# Patient Record
Sex: Male | Born: 1995 | Race: White | Hispanic: No | Marital: Single | Smoking: Current some day smoker
Health system: Southern US, Community
[De-identification: ages and names within clinical notes are randomized; demographics above are authoritative.]

## PROBLEM LIST (undated history)

## (undated) DIAGNOSIS — J45909 Unspecified asthma, uncomplicated: Secondary | ICD-10-CM

## (undated) HISTORY — PX: JOINT REPLACEMENT: SHX530

---

## 2016-10-16 ENCOUNTER — Emergency Department (HOSPITAL_COMMUNITY)
Admission: EM | Admit: 2016-10-16 | Discharge: 2016-10-16 | Disposition: A | Discharge status: Home / Self Care | Payer: Self-pay | Attending: Emergency Medicine | Admitting: Emergency Medicine

## 2016-10-16 ENCOUNTER — Emergency Department (HOSPITAL_COMMUNITY): Payer: Self-pay

## 2016-10-16 ENCOUNTER — Encounter (HOSPITAL_COMMUNITY): Payer: Self-pay | Admitting: *Deleted

## 2016-10-16 DIAGNOSIS — G8929 Other chronic pain: Secondary | ICD-10-CM | POA: Insufficient documentation

## 2016-10-16 DIAGNOSIS — R05 Cough: Secondary | ICD-10-CM | POA: Insufficient documentation

## 2016-10-16 DIAGNOSIS — R61 Generalized hyperhidrosis: Secondary | ICD-10-CM | POA: Insufficient documentation

## 2016-10-16 DIAGNOSIS — T148XXA Other injury of unspecified body region, initial encounter: Secondary | ICD-10-CM

## 2016-10-16 DIAGNOSIS — F17210 Nicotine dependence, cigarettes, uncomplicated: Secondary | ICD-10-CM | POA: Insufficient documentation

## 2016-10-16 DIAGNOSIS — J45909 Unspecified asthma, uncomplicated: Secondary | ICD-10-CM | POA: Insufficient documentation

## 2016-10-16 DIAGNOSIS — Y929 Unspecified place or not applicable: Secondary | ICD-10-CM | POA: Insufficient documentation

## 2016-10-16 DIAGNOSIS — M546 Pain in thoracic spine: Secondary | ICD-10-CM | POA: Insufficient documentation

## 2016-10-16 DIAGNOSIS — M549 Dorsalgia, unspecified: Secondary | ICD-10-CM

## 2016-10-16 HISTORY — DX: Unspecified asthma, uncomplicated: J45.909

## 2016-10-16 LAB — CBC WITH DIFFERENTIAL/PLATELET
Basophils Absolute: 0.1 10*3/uL (ref 0.0–0.1)
Basophils Relative: 1 %
EOS PCT: 4 %
Eosinophils Absolute: 0.4 10*3/uL (ref 0.0–0.7)
HCT: 39.8 % (ref 39.0–52.0)
HEMOGLOBIN: 14 g/dL (ref 13.0–17.0)
LYMPHS ABS: 2.9 10*3/uL (ref 0.7–4.0)
LYMPHS PCT: 26 %
MCH: 31 pg (ref 26.0–34.0)
MCHC: 35.2 g/dL (ref 30.0–36.0)
MCV: 88.1 fL (ref 78.0–100.0)
MONOS PCT: 5 %
Monocytes Absolute: 0.6 10*3/uL (ref 0.1–1.0)
NEUTROS PCT: 64 %
Neutro Abs: 7.2 10*3/uL (ref 1.7–7.7)
Platelets: 263 10*3/uL (ref 150–400)
RBC: 4.52 MIL/uL (ref 4.22–5.81)
RDW: 13 % (ref 11.5–15.5)
WBC: 11.1 10*3/uL — AB (ref 4.0–10.5)

## 2016-10-16 LAB — I-STAT CHEM 8, ED
BUN: 14 mg/dL (ref 6–20)
CHLORIDE: 102 mmol/L (ref 101–111)
Calcium, Ion: 1.17 mmol/L (ref 1.15–1.40)
Creatinine, Ser: 0.9 mg/dL (ref 0.61–1.24)
GLUCOSE: 92 mg/dL (ref 65–99)
HCT: 42 % (ref 39.0–52.0)
Hemoglobin: 14.3 g/dL (ref 13.0–17.0)
POTASSIUM: 4.5 mmol/L (ref 3.5–5.1)
Sodium: 139 mmol/L (ref 135–145)
TCO2: 28 mmol/L (ref 22–32)

## 2016-10-16 LAB — URINALYSIS, ROUTINE W REFLEX MICROSCOPIC
BILIRUBIN URINE: NEGATIVE
GLUCOSE, UA: NEGATIVE mg/dL
HGB URINE DIPSTICK: NEGATIVE
Ketones, ur: NEGATIVE mg/dL
Leukocytes, UA: NEGATIVE
Nitrite: NEGATIVE
PROTEIN: NEGATIVE mg/dL
SPECIFIC GRAVITY, URINE: 1.018 (ref 1.005–1.030)
pH: 5 (ref 5.0–8.0)

## 2016-10-16 LAB — COMPREHENSIVE METABOLIC PANEL
ALT: 18 U/L (ref 17–63)
ANION GAP: 7 (ref 5–15)
AST: 20 U/L (ref 15–41)
Albumin: 4.4 g/dL (ref 3.5–5.0)
Alkaline Phosphatase: 64 U/L (ref 38–126)
BUN: 11 mg/dL (ref 6–20)
CO2: 25 mmol/L (ref 22–32)
CREATININE: 0.97 mg/dL (ref 0.61–1.24)
Calcium: 9.4 mg/dL (ref 8.9–10.3)
Chloride: 104 mmol/L (ref 101–111)
Glucose, Bld: 89 mg/dL (ref 65–99)
Potassium: 4.2 mmol/L (ref 3.5–5.1)
Sodium: 136 mmol/L (ref 135–145)
Total Bilirubin: 0.9 mg/dL (ref 0.3–1.2)
Total Protein: 6.9 g/dL (ref 6.5–8.1)

## 2016-10-16 LAB — D-DIMER, QUANTITATIVE: D-Dimer, Quant: 0.61 ug/mL-FEU — ABNORMAL HIGH (ref 0.00–0.50)

## 2016-10-16 LAB — RAPID HIV SCREEN (HIV 1/2 AB+AG)
HIV 1/2 Antibodies: NONREACTIVE
HIV-1 P24 ANTIGEN - HIV24: NONREACTIVE

## 2016-10-16 LAB — I-STAT TROPONIN, ED: TROPONIN I, POC: 0 ng/mL (ref 0.00–0.08)

## 2016-10-16 LAB — SEDIMENTATION RATE: SED RATE: 2 mm/h (ref 0–16)

## 2016-10-16 LAB — LIPASE, BLOOD: LIPASE: 23 U/L (ref 11–51)

## 2016-10-16 MED ORDER — OXYCODONE-ACETAMINOPHEN 5-325 MG PO TABS: 1.0000 | ORAL_TABLET | Freq: Four times a day (QID) | ORAL | 0 refills | Status: AC | PRN

## 2016-10-16 MED ORDER — METHOCARBAMOL 500 MG PO TABS
500.0000 mg | ORAL_TABLET | Freq: Once | ORAL | Status: AC
  Administered 2016-10-16: 500 mg via ORAL
  Filled 2016-10-16: qty 1

## 2016-10-16 MED ORDER — IBUPROFEN 600 MG PO TABS: 600.0000 mg | ORAL_TABLET | Freq: Four times a day (QID) | ORAL | 0 refills | Status: AC | PRN

## 2016-10-16 MED ORDER — ONDANSETRON HCL 4 MG/2ML IJ SOLN
4.0000 mg | Freq: Once | INTRAMUSCULAR | Status: AC
  Administered 2016-10-16: 4 mg via INTRAVENOUS
  Filled 2016-10-16: qty 2

## 2016-10-16 MED ORDER — OXYCODONE-ACETAMINOPHEN 5-325 MG PO TABS
1.0000 | ORAL_TABLET | Freq: Once | ORAL | Status: AC
  Administered 2016-10-16: 1 via ORAL
  Filled 2016-10-16: qty 1

## 2016-10-16 MED ORDER — IOPAMIDOL (ISOVUE-370) INJECTION 76%
INTRAVENOUS | Status: AC
  Administered 2016-10-16: 80 mL
  Filled 2016-10-16: qty 100

## 2016-10-16 MED ORDER — CYCLOBENZAPRINE HCL 5 MG PO TABS: 5.0000 mg | ORAL_TABLET | Freq: Three times a day (TID) | ORAL | 0 refills | Status: AC | PRN

## 2016-10-16 MED ORDER — SODIUM CHLORIDE 0.9 % IV BOLUS (SEPSIS)
1000.0000 mL | Freq: Once | INTRAVENOUS | Status: AC
  Administered 2016-10-16: 1000 mL via INTRAVENOUS

## 2016-10-16 MED ORDER — KETOROLAC TROMETHAMINE 15 MG/ML IJ SOLN
15.0000 mg | Freq: Once | INTRAMUSCULAR | Status: AC
  Administered 2016-10-16: 15 mg via INTRAVENOUS
  Filled 2016-10-16: qty 1

## 2016-10-16 NOTE — ED Notes (Signed)
Patient transported to CT 

## 2016-10-16 NOTE — ED Provider Notes (Signed)
[facility]EMERGENCY DEPT Provider Note   CSN: [id number] Arrival date & time: 10/16/16  4098     History   Chief Complaint Chief Complaint  Patient presents with  . Back Pain    HPI Juan Lawrence is a 21 y.o. male.  HPI   Patient is a 21 year old male with a history of asthma and clavicle surgery of the left shoulder presenting for acute on chronic upper back pain. Patient reports he has had this pain for at least 6 months, however it worsened yesterday while he was sitting at his desk during a training course. Patient denies any history of trauma at the onset of the symptoms. The first episode of pain occurred when patient was smoking a cigarette. Patient reports pain is midline to left of midline in lower cervical/upper thoracic region, and 9 out of 10 in quality. Patient reports that breathing, swallowing, or coughing exacerbates the pain. Patient reports he has a slightly increased cough with this episode, but nonproductive of blood or sputum. No chest pain or shortness of breath. Patient denies fever or chills. Patient denies any saddle anesthesia, loss of bowel or bladder control, or urinary retention. Patient denies any weakness or numbness in his upper or lower extremities. Patient denies any lower extremity edema. Patient denies any history of cancer or IV drug use. Patient is a current every day smoker. Patient reports he has tried ice, heat, and ibuprofen for his symptoms.   As a secondary problem, patient notes that he has had dry mucous membranes over a similar interval axis back pain and reports that he has had difficulty swallowing due to an upper throat feeling of obstruction. Patient reports this has caused him to lose weight over the past several months.   Past Medical History:  Diagnosis Date  . Asthma     There are no active problems to display for this patient.   Past Surgical History:  Procedure Laterality Date  . JOINT REPLACEMENT         Home  Medications    Prior to Admission medications   Not on File    Family History No family history on file.  Social History Social History  Substance Use Topics  . Smoking status: Current Some Day Smoker    Packs/day: 0.50    Types: Cigarettes  . Smokeless tobacco: Never Used  . Alcohol use No     Allergies   Patient has no known allergies.   Review of Systems Review of Systems  Constitutional: Positive for appetite change and diaphoresis. Negative for chills and fever.  HENT: Negative for rhinorrhea and sore throat.   Respiratory: Positive for cough. Negative for chest tightness and shortness of breath.   Cardiovascular: Negative for chest pain and leg swelling.  Gastrointestinal: Negative for abdominal pain, diarrhea, nausea and vomiting.  Genitourinary: Negative for dysuria and flank pain.  Musculoskeletal: Positive for arthralgias and back pain. Negative for myalgias.  Skin: Negative for rash.  Neurological: Negative for dizziness, weakness, light-headedness, numbness and headaches.     Physical Exam Updated Vital Signs BP 118/76   Pulse 94   Temp 98.1 F (36.7 C) (Oral)   Resp 16   SpO2 97%   Physical Exam  Constitutional: He appears well-developed and well-nourished. No distress.  Thin appearing young male.  HENT:  Head: Normocephalic and atraumatic.  Eyes: Conjunctivae and EOM are normal. Right eye exhibits no discharge. Left eye exhibits no discharge.  EOMs normal to gross examination.  Neck: Normal range  of motion.  Cardiovascular: Normal rate, regular rhythm and normal heart sounds.   Intact, 2+ radial pulse.  Pulmonary/Chest: Effort normal and breath sounds normal. He has no wheezes. He has no rales.  Normal respiratory effort. Patient converses comfortably. No audible wheeze or stridor.  Abdominal: Soft. He exhibits no distension. There is no tenderness. There is no guarding.  Musculoskeletal: Normal range of motion. He exhibits no edema or  deformity.  INSPECTION: Symmetry of scapula and no obvious deformity of the spine.  PALPATION: Midline to paraspinal musculature tenderness lower cervical/upper thoracic spine.  MOTOR: 5/5 strength b/l with resisted shoulder abduction/adduction, biceps flexion (C5/6), biceps extension (C6-C8), wrist flexion, wrist extension (C6-C8), and grip strength (C7-T1) SENSORY: Sensation is intact to light touch in:  Superficial radial nerve distribution (dorsal first web space) Median nerve distribution (tip of index finger)   Ulnar nerve distribution (tip of small finger)   Lymphadenopathy:    He has no cervical adenopathy.  Neurological: He is alert.  Cranial nerves intact to gross observation. Full ROM of upper and lower extremities. Strength 5/5 upper/lower extremities. Gait symmetric and ambulation without difficulty.  Skin: Skin is warm and dry. No rash noted. He is not diaphoretic.  Psychiatric: He has a normal mood and affect. Judgment and thought content normal.  Nursing note and vitals reviewed.    ED Treatments / Results  Labs (all labs ordered are listed, but only abnormal results are displayed) Labs Reviewed  CBC WITH DIFFERENTIAL/PLATELET  COMPREHENSIVE METABOLIC PANEL  LIPASE, BLOOD  SEDIMENTATION RATE  RAPID HIV SCREEN (HIV 1/2 AB+AG)  URINALYSIS, ROUTINE W REFLEX MICROSCOPIC  D-DIMER, QUANTITATIVE (NOT AT [facility])  I-STAT CHEM 8, ED  I-STAT TROPONIN, ED    EKG  EKG Interpretation None       Radiology No results found.  Procedures Procedures (including critical care time)  Medications Ordered in ED Medications  sodium chloride 0.9 % bolus 1,000 mL (not administered)  ondansetron (ZOFRAN) injection 4 mg (not administered)     Initial Impression / Assessment and Plan / ED Course  I have reviewed the triage vital signs and the nursing notes.  Pertinent labs & imaging results that were available during my care of the patient were reviewed by me and considered  in my medical decision making (see chart for details).     Final Clinical Impressions(s) / ED Diagnoses   Final diagnoses:  Back pain   MDM  Patient with acute on chronic upper back pain. Differential diagnoses for this acute on chronic pain explored include inflammatory spondyloarthropathies, musculoskeletal strain, pulmonary embolism, epidural abscess, disc herniation, thoracic outlet syndrome. Additionally, patient appears to have dysphagia that has not been evaluated which is seemingly unrelated to this pain at this time. Patient reevaluated multiple times after Toradol, Robaxin, and finally after Percocet for pain control. Due to concern for further systemic illness or generalized inflammatory state, workup initiated in the consultation of Dr. Chaney Malling. D-dimer slightly elevated, prompting further workup with CTPA. Patient neurologically intact and demonstrates no further requirement for further imaging at this time. Suspect possible nerve impingement or muscle spasm. Patient given strict return precautions for any worsening neurologic symptoms such as numbness or weakness in the upper/lower extremities, loss of bowel or bladder control, urinary retention. Patient with severe pain and currently in transit with his significant other for a work training. Patient to establish primary care follow-up and possible subsequent specialty follow-up and in his home state of  [place].  I have reviewed  the patient's information in the [facility] Controlled Substance Database for the past 2 yaers and found them to have no controlled substance prescriptions. [place] searched and no controlled substance prescriptions identified in 2 years.  Opiates were prescribed for an acute, painful condition. The patient was given information on side effects and encouraged to use other, non-opiate pain medication primary, only using opiate medicine sparingly for severe pain. Patient instructed not to drive  on this medication.  This is a shared visit with Dr. Chaney Malling. Patient was independently evaluated by this attending physician. Attending physician assisted in evaluation, prescribing, and discharge management.  New Prescriptions New Prescriptions   No medications on file     Delia Chimes 10/16/16 1855    Charlynne Pander, MD 10/17/16 [phone]

## 2016-10-16 NOTE — Discharge Instructions (Addendum)
Take motrin for pain.   Take flexeril for muscle spasms.   Take percocet for severe pain.   See wellness center or your doctor in [place] for follow up   Return to ER if you have worse abdominal pain, chest pain, back pain, dehydration, fever, vomiting.

## 2016-10-16 NOTE — ED Notes (Signed)
Pt st's he does not have any money to get Rx's filled.  Explained to pt that I would talk to case manager ref help with Flexeril.  Pt verbalizes understanding.

## 2016-10-16 NOTE — ED Notes (Signed)
Pt reports pain from mid-back to epigastric area 1 year. States has "an eating disorder", has difficulty swallowing, "can't eat much", "vomits a lot". Pain became very severe last pm.

## 2016-10-16 NOTE — ED Triage Notes (Signed)
To ED for eval of upper back pain for the past 2 days - severe. Pt states prior to the past 2 days the pain was there but not as severe. Pain increases with any movement of left arm, neck, or twisting. Denies trauma. No increased pain with right arm movement.

## 2018-12-06 IMAGING — CR DG LUMBAR SPINE COMPLETE 4+V
5 series · 5 of 5 positions shown · non-contrast
Comparison: None in PACs.

CLINICAL DATA: Via back pain for the past 2 days. Milder pain
previous to this. Pain is increase with twisting motion. No known
trauma.

EXAM:
LUMBAR SPINE - COMPLETE 4+ VIEW

[l-spine ap]
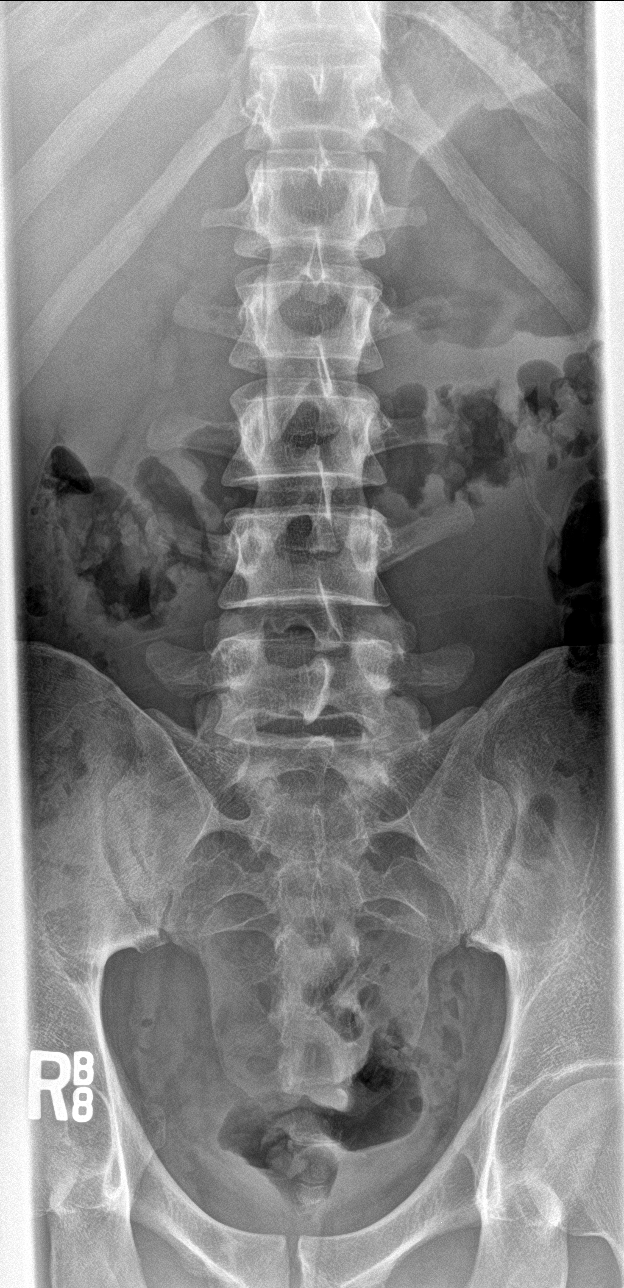

[l-spine obl (1 of 2)]
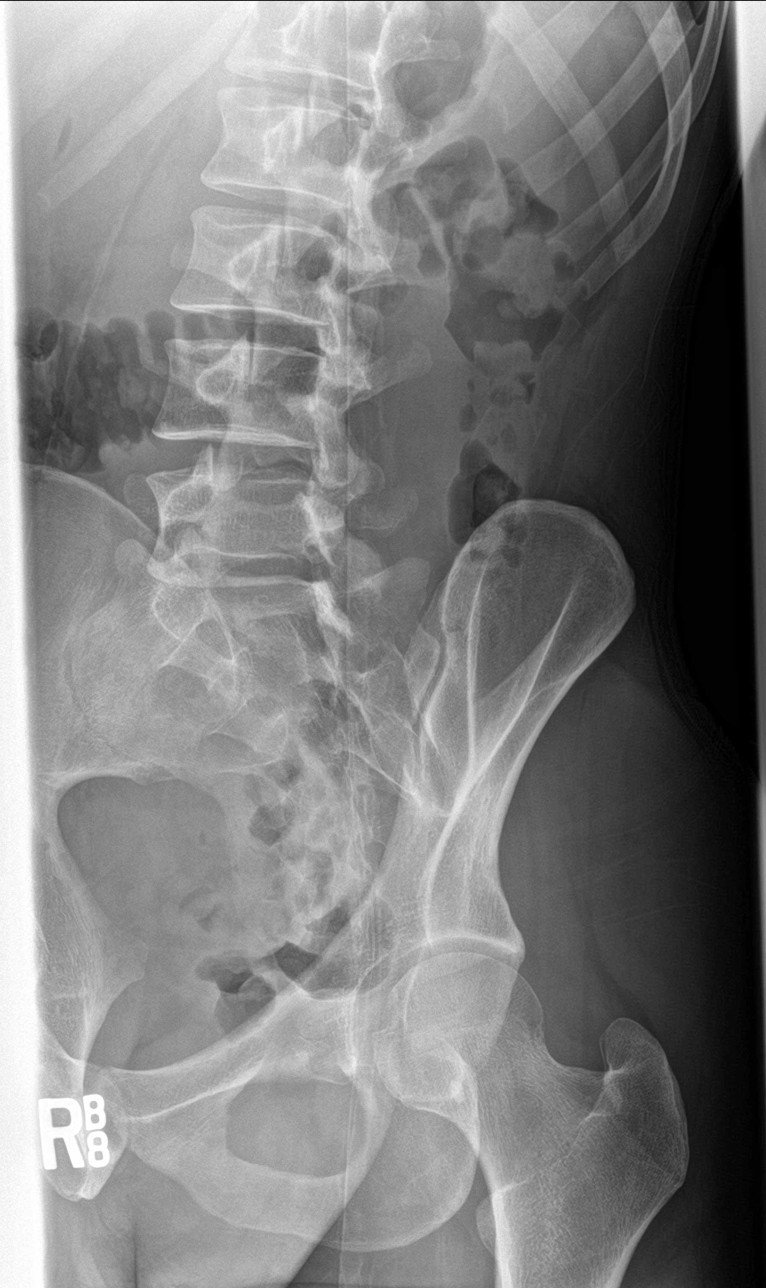

[l-spine obl (2 of 2)]
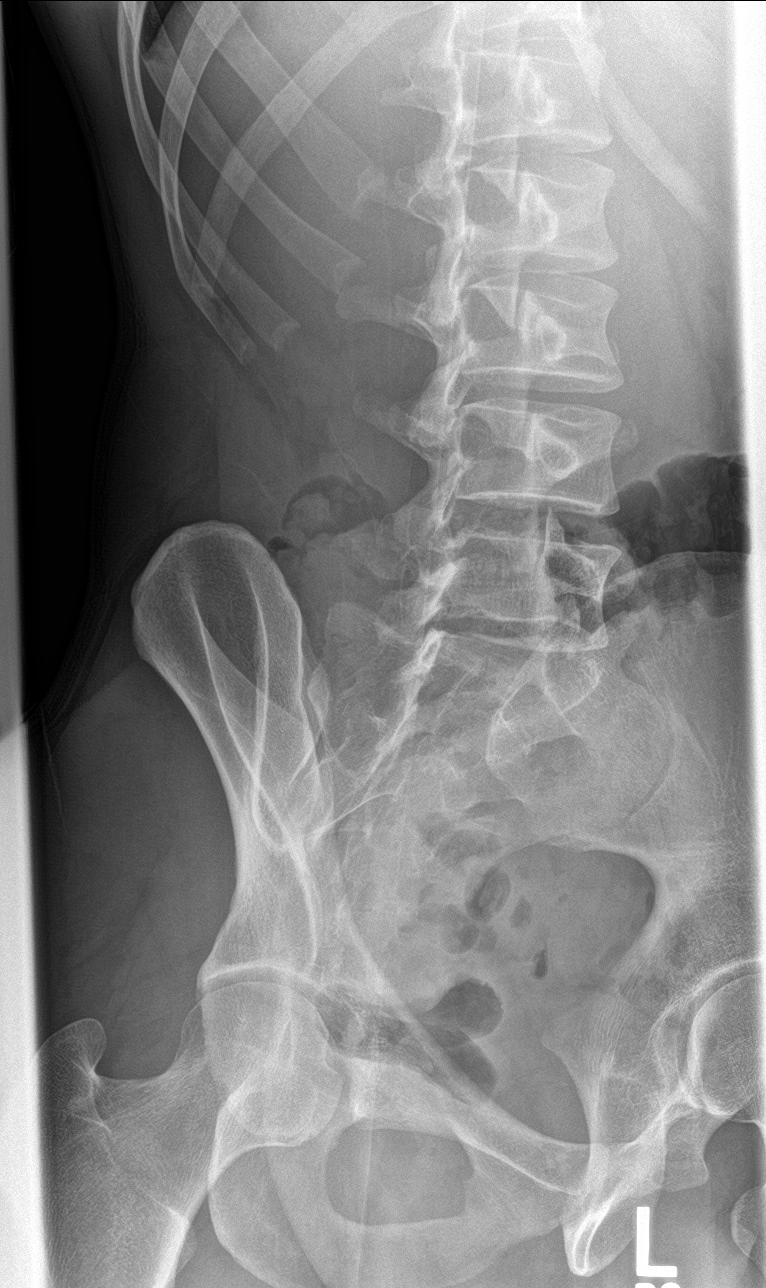

[l-spine lat]
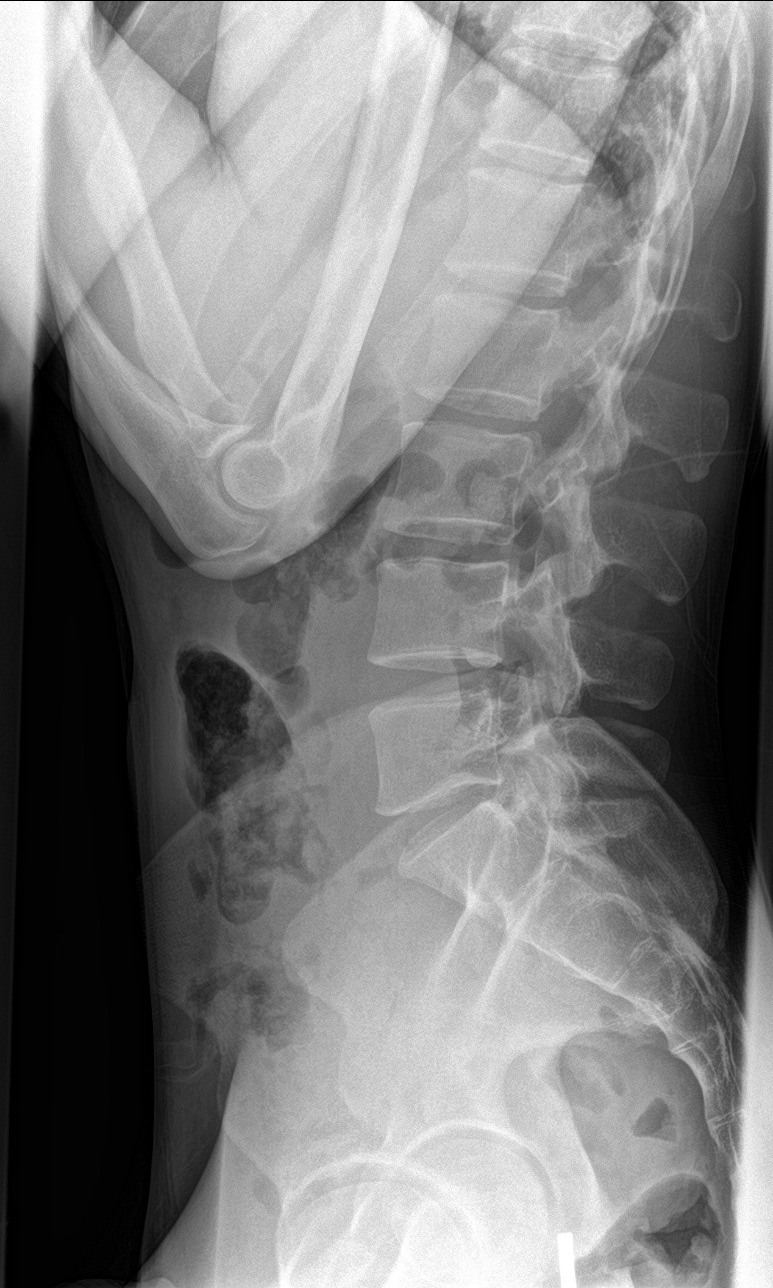

[l-spine spot]
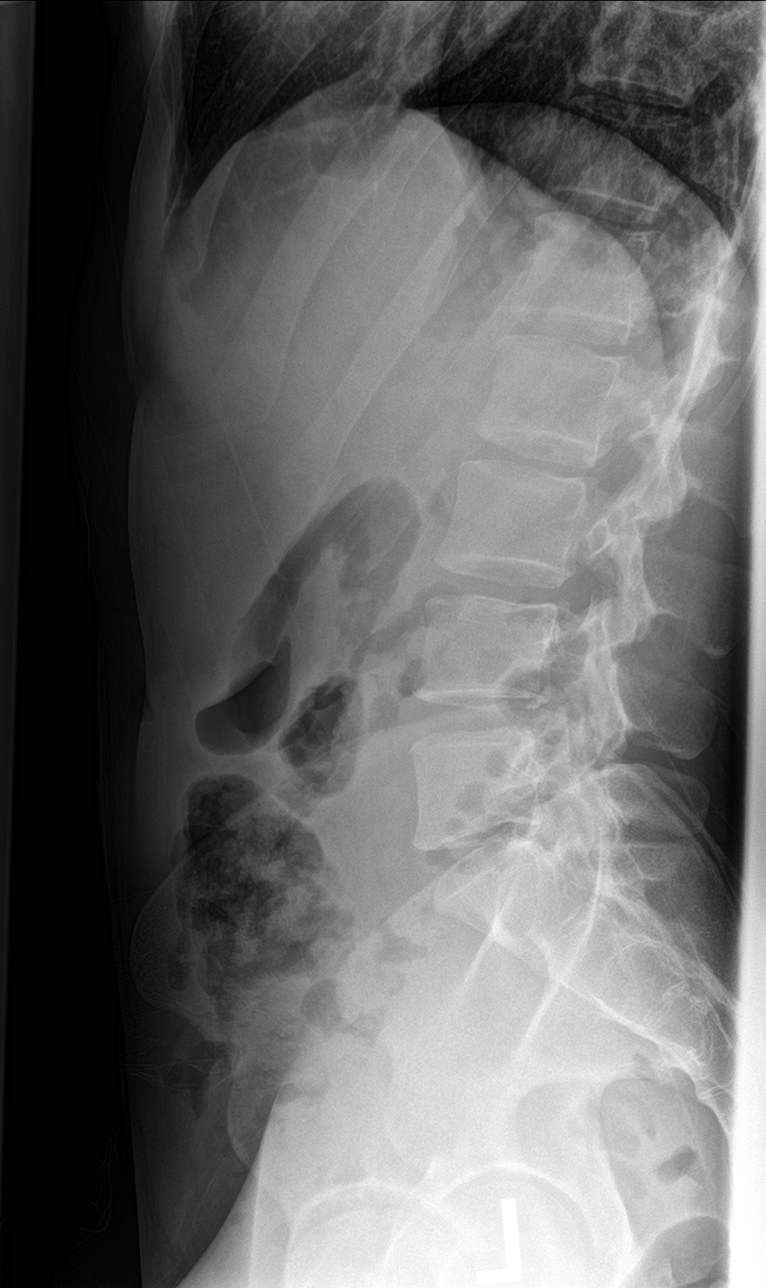

[5 of 5 positions shown; findings below may reference images not displayed]

FINDINGS: The lumbar vertebral bodies are preserved in height. The pedicles
and transverse processes are intact. The disc space heights are well
maintained. There is no spondylolisthesis. There is no significant
facet joint hypertrophy. The observed portions of the sacrum are
normal.
IMPRESSION: There is no acute or chronic bony abnormality of the lumbar spine.

## 2018-12-06 IMAGING — CT CT ANGIO CHEST
2 of 6 series · 18 of 36 positions shown · IV contrast (Omni 300)
Comparison: Chest radiograph October 16, 2016

CLINICAL DATA: Shortness of breath and chest pain

EXAM:
CT ANGIOGRAPHY CHEST WITH CONTRAST
TECHNIQUE: Multidetector CT imaging of the chest was performed using the
standard protocol during bolus administration of intravenous
contrast. Multiplanar CT image reconstructions and MIPs were
obtained to evaluate the vascular anatomy.
CONTRAST:  80 mL Isovue 370 nonionic

[Series 7: pe thins · axial · 0.75mm/px · z∈[-320,-44]mm · 17 of 312 slices shown]
[im 18/312  lung]
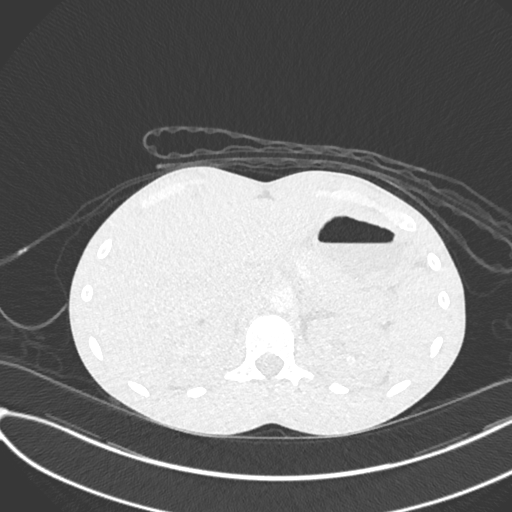
[im 35/312  mediastinal]
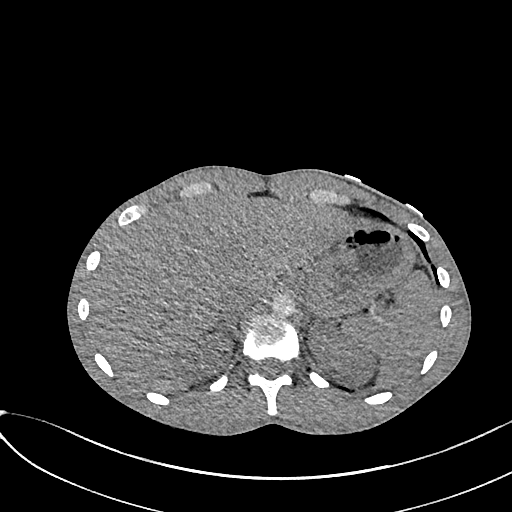
[im 52/312  lung]
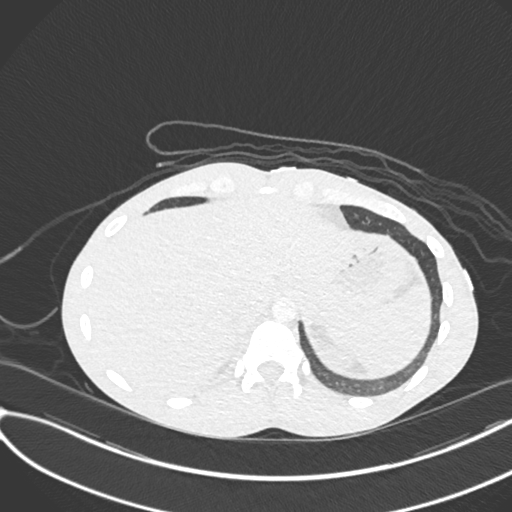
[im 70/312  mediastinal]
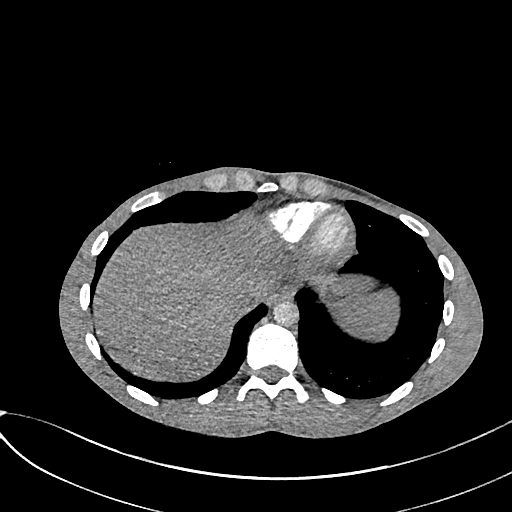
[im 87/312  lung]
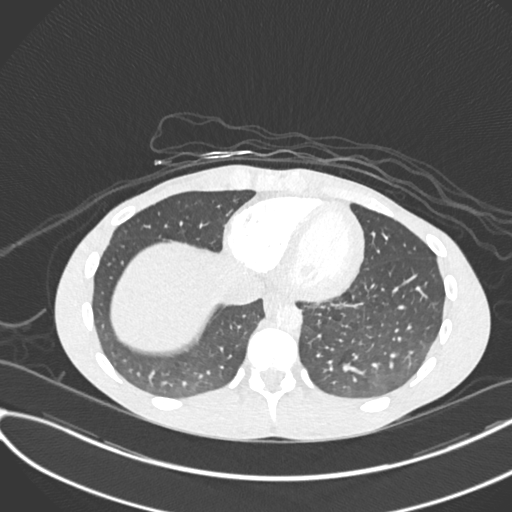
[im 104/312  mediastinal]
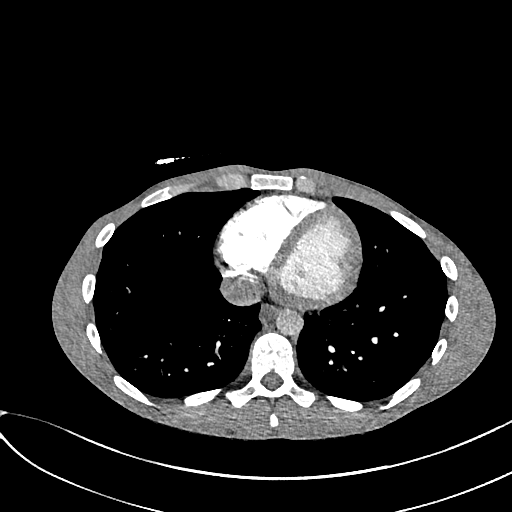
[im 121/312  lung]
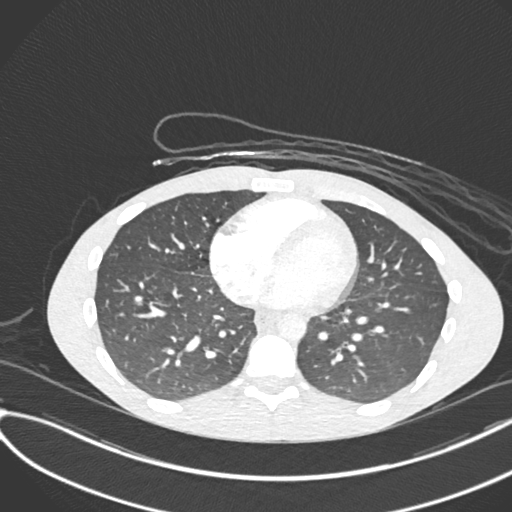
[im 139/312  mediastinal]
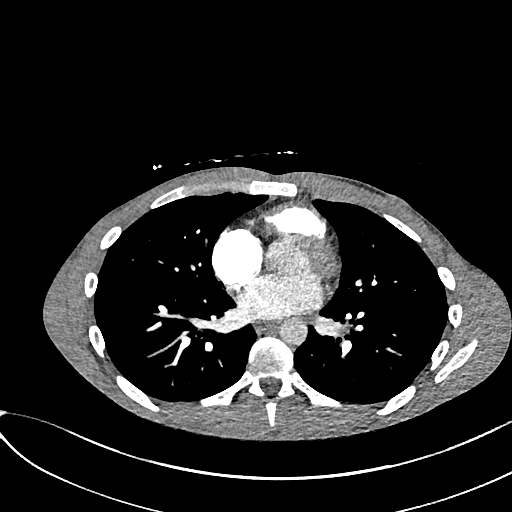
[im 156/312  lung]
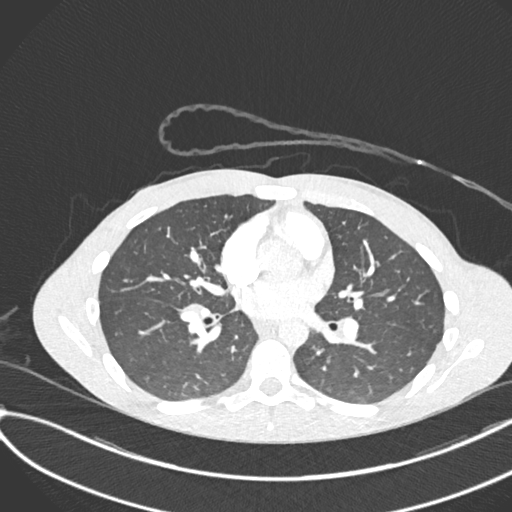
[im 173/312  mediastinal]
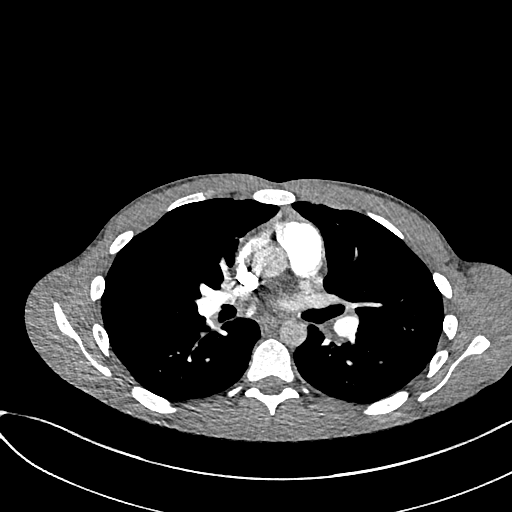
[im 191/312  lung]
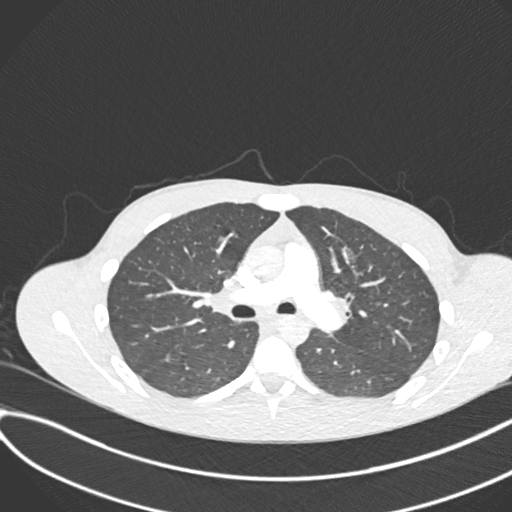
[im 208/312  mediastinal]
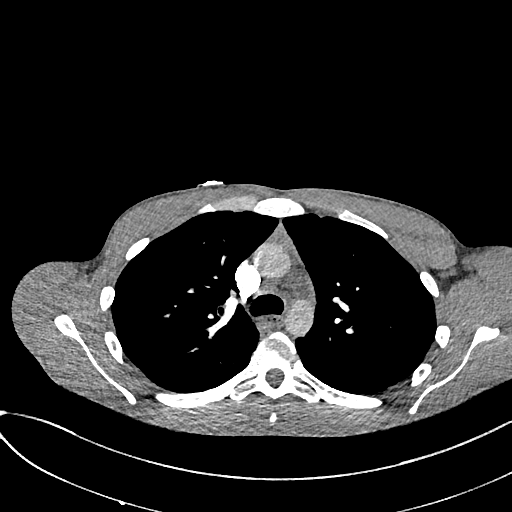
[im 225/312  lung]
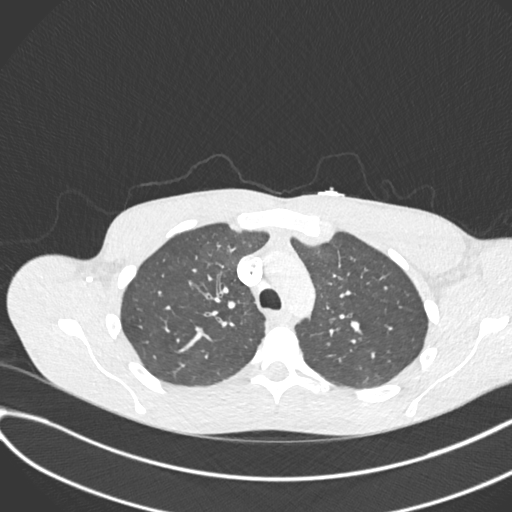
[im 242/312  mediastinal]
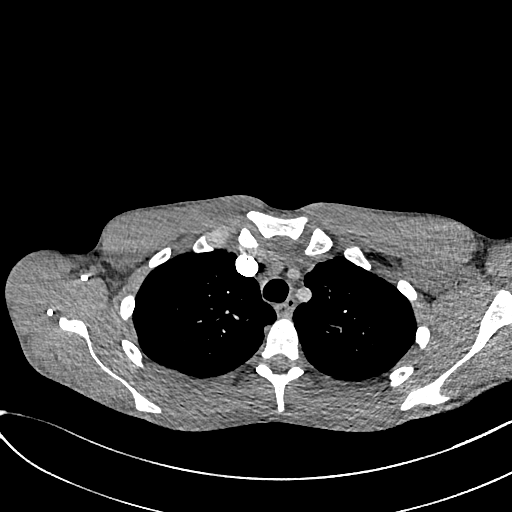
[im 260/312  lung]
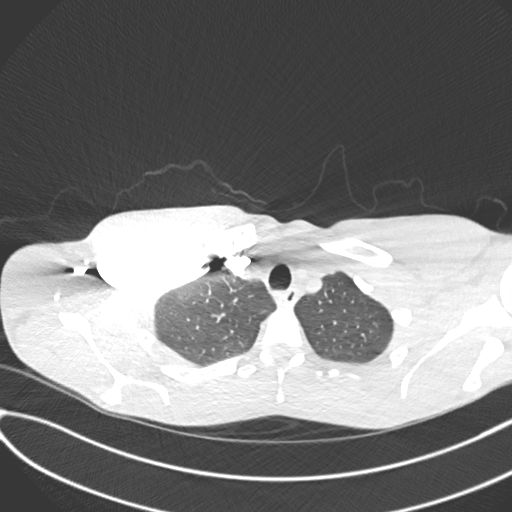
[im 277/312  mediastinal]
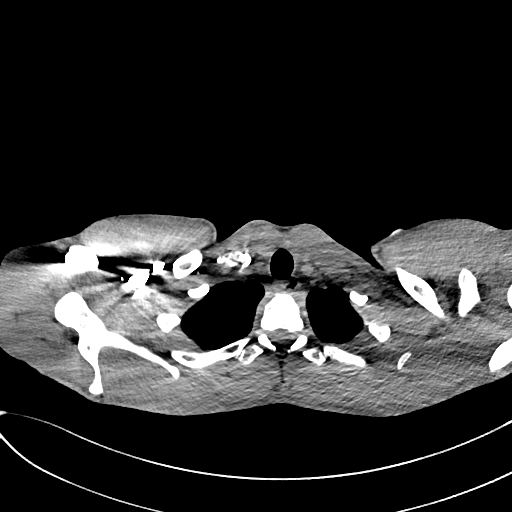
[im 294/312  lung]
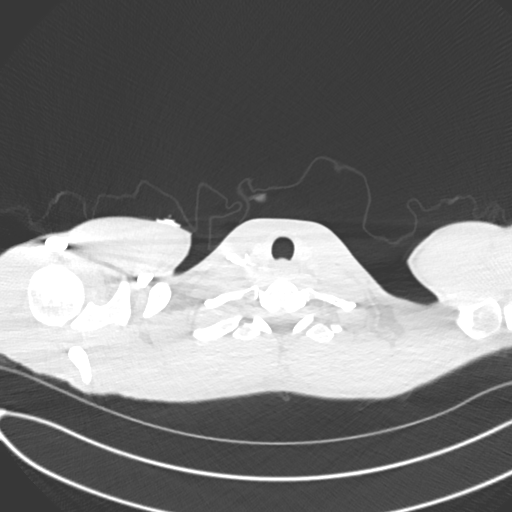

[Series 8: pe 2mm cor · coronal · 0.61mm/px · 1 of 109 slices shown]
[im 55/109  mediastinal]
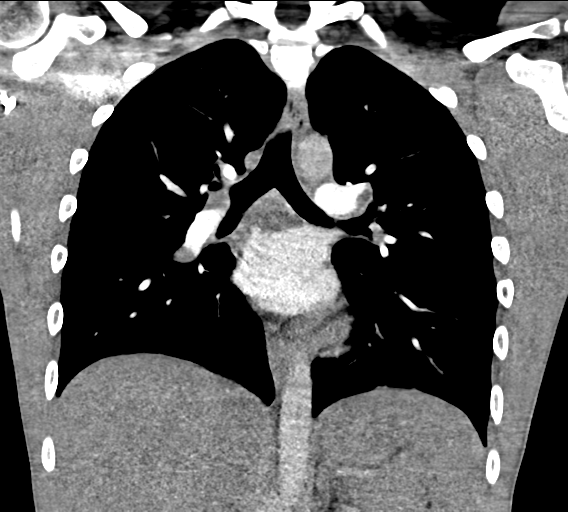

[18 of 36 positions shown; findings below may reference images not displayed]

FINDINGS: Cardiovascular: There is no demonstrable pulmonary embolus. There is
no thoracic aortic aneurysm or dissection. The contrast bolus in the
thoracic aorta is less than optimal for assessment for potential
dissection. Mucosal lesion is evident in the thoracic aorta.
Visualized great vessels appear normal. Pericardium is not
appreciably thickened.

Mediastinum/Nodes: Thyroid appears unremarkable. There is no
appreciable thoracic adenopathy. No esophageal lesions are evident.
There is a small amount of thymic tissue, normal for age.

Lungs/Pleura: There is no edema or consolidation. No pleural
effusion or pleural thickening evident. There is an apparent lymph
node along the left major fissure measuring 6 x 5 mm, seen on axial
slice 100 series 6.

Upper Abdomen: Visualized upper abdominal structures appear
unremarkable.

Musculoskeletal: There are no blastic or lytic bone lesions peer

Review of the MIP images confirms the above findings.
IMPRESSION: 1. No demonstrable pulmonary embolus. No thoracic aortic aneurysm or
dissection. Note that the contrast bolus for assessment of the aorta
for potential dissection is rather limited.

2.  Lungs clear.

3. Subcentimeter apparent lymph node arising from the left major
fissure. No adenopathy by size criteria.
